# Patient Record
Sex: Male | Born: 1970 | Race: White | Hispanic: No | Marital: Married | Smoking: Former smoker
Health system: Southern US, Community
[De-identification: ages and names within clinical notes are randomized; demographics above are authoritative.]

## PROBLEM LIST (undated history)

## (undated) DIAGNOSIS — G510 Bell's palsy: Secondary | ICD-10-CM

---

## 2016-03-21 ENCOUNTER — Emergency Department (HOSPITAL_COMMUNITY): Payer: Self-pay

## 2016-03-21 ENCOUNTER — Emergency Department (HOSPITAL_COMMUNITY)
Admission: EM | Admit: 2016-03-21 | Discharge: 2016-03-21 | Disposition: A | Discharge status: Home / Self Care | Payer: Self-pay | Attending: Emergency Medicine | Admitting: Emergency Medicine

## 2016-03-21 ENCOUNTER — Encounter (HOSPITAL_COMMUNITY): Payer: Self-pay | Admitting: Emergency Medicine

## 2016-03-21 DIAGNOSIS — R079 Chest pain, unspecified: Secondary | ICD-10-CM | POA: Insufficient documentation

## 2016-03-21 DIAGNOSIS — F17210 Nicotine dependence, cigarettes, uncomplicated: Secondary | ICD-10-CM | POA: Insufficient documentation

## 2016-03-21 LAB — CBC
HCT: 45.2 % (ref 39.0–52.0)
Hemoglobin: 15.2 g/dL (ref 13.0–17.0)
MCH: 30 pg (ref 26.0–34.0)
MCHC: 33.6 g/dL (ref 30.0–36.0)
MCV: 89.2 fL (ref 78.0–100.0)
PLATELETS: 272 10*3/uL (ref 150–400)
RBC: 5.07 MIL/uL (ref 4.22–5.81)
RDW: 13.6 % (ref 11.5–15.5)
WBC: 5.6 10*3/uL (ref 4.0–10.5)

## 2016-03-21 LAB — BASIC METABOLIC PANEL
ANION GAP: 8 (ref 5–15)
BUN: 11 mg/dL (ref 6–20)
CALCIUM: 9.6 mg/dL (ref 8.9–10.3)
CO2: 30 mmol/L (ref 22–32)
CREATININE: 1.1 mg/dL (ref 0.61–1.24)
Chloride: 101 mmol/L (ref 101–111)
GFR calc Af Amer: >60 mL/min (ref >60)
GLUCOSE: 107 mg/dL — AB (ref 65–99)
Potassium: 3.5 mmol/L (ref 3.5–5.1)
Sodium: 139 mmol/L (ref 135–145)

## 2016-03-21 LAB — I-STAT TROPONIN, ED: TROPONIN I, POC: 0 ng/mL (ref 0.00–0.08)

## 2016-03-21 MED ORDER — IBUPROFEN 600 MG PO TABS: 600.0000 mg | ORAL_TABLET | Freq: Four times a day (QID) | ORAL | 0 refills | Status: DC | PRN

## 2016-03-21 MED ORDER — OMEPRAZOLE 20 MG PO CPDR: 20.0000 mg | DELAYED_RELEASE_CAPSULE | Freq: Every day | ORAL | 0 refills | Status: DC

## 2016-03-21 NOTE — ED Triage Notes (Signed)
Pt from home with c/o sharp central/left chest pain "for a while but got worse a couple of days ago."  Pt reports increased pain with inspiration.  Pt additionally reports feeling pressure in his head prior to getting dizzy.  NAD, A&O.

## 2016-03-21 NOTE — ED Notes (Signed)
Pt d/c home with significant other at this time.

## 2016-03-21 NOTE — Discharge Instructions (Signed)
If [facility] is not accepting new patients you may call the phone number listed at the end of this packet for assistance in establishing primary care. Return to the ER for new or worsening symptoms.

## 2016-03-22 NOTE — ED Provider Notes (Signed)
[facility]-EMERGENCY DEPT Provider Note   CSN: [id number] Arrival date & time: 03/21/16  1407     History   Chief Complaint Chief Complaint  Patient presents with  . Chest Pain  . Shortness of Breath    HPI   Steven Pruitt is an 45 y.o. male who presents to the ED for evaluation of chest pain. He states for the past two years he constantly feels a nagging soreness in his left chest that at times worsens in severity. It has acutely worsened over the past couple of days. The pain is constant. It does not radiate. He states at times he feels an associated headache. Denies SOB. Denies trying anything for the pain. He currently rates it 3/10. He has never been evaluated for the pain anytime in the past two years. It is not worse with exertion or position change. Denies leg pain or swelling. Denies recent travel. Denies history of clots or malignancy. He does endorse smoking MJ and cigarettes daily. Denies fever, chills, abdominal pain, n/v/d, weakness, numbness. He does not know if he has any significant fam hx.  History reviewed. No pertinent past medical history.  There are no active problems to display for this patient.   History reviewed. No pertinent surgical history.     Home Medications    Prior to Admission medications   Medication Sig Start Date End Date Taking? Authorizing Provider  calcium carbonate (TUMS - DOSED IN MG ELEMENTAL CALCIUM) 500 MG chewable tablet Chew 1-2 tablets by mouth daily.   Yes Historical Provider, MD  ibuprofen (ADVIL,MOTRIN) 600 MG tablet Take 1 tablet (600 mg total) by mouth every 6 (six) hours as needed (pain). 03/21/16   Ace Gins Wrangler Penning, PA-C  omeprazole (PRILOSEC) 20 MG capsule Take 1 capsule (20 mg total) by mouth daily. 03/21/16   Carlene Coria, PA-C    Family History History reviewed. No pertinent family history.  Social History Social History  Substance Use Topics  . Smoking status: Current Every Day Smoker    Packs/day: 0.50    Types: Cigarettes   . Smokeless tobacco: Never Used  . Alcohol use Yes     Comment: occasionally     Allergies   Review of patient's allergies indicates no known allergies.   Review of Systems Review of Systems 10 Systems reviewed and are negative for acute change except as noted in the HPI.  Physical Exam Updated Vital Signs BP 115/79 (BP Location: Left Arm)   Pulse 64   Temp 98.1 F (36.7 C) (Oral)   Resp 18   SpO2 100%   Physical Exam  Constitutional: He is oriented to person, place, and time.  HENT:  Right Ear: External ear normal.  Left Ear: External ear normal.  Nose: Nose normal.  Mouth/Throat: Oropharynx is clear and moist. No oropharyngeal exudate.  Eyes: Conjunctivae are normal.  Neck: Neck supple.  Cardiovascular: Normal rate, regular rhythm, normal heart sounds and intact distal pulses.   Pulmonary/Chest: Effort normal and breath sounds normal. No respiratory distress. He has no wheezes.  Abdominal: Soft. Bowel sounds are normal. He exhibits no distension. There is no tenderness. There is no rebound and no guarding.  Musculoskeletal: He exhibits no edema.  No calf edema or tenderness 2+ peripheral pulses x 4  Lymphadenopathy:    He has no cervical adenopathy.  Neurological: He is alert and oriented to person, place, and time. No cranial nerve deficit.  Skin: Skin is warm and dry.  Psychiatric: He has a normal  mood and affect.  Nursing note and vitals reviewed.    ED Treatments / Results  Labs (all labs ordered are listed, but only abnormal results are displayed) Labs Reviewed  BASIC METABOLIC PANEL - Abnormal; Notable for the following:       Result Value   Glucose, Bld 107 (*)    All other components within normal limits  CBC  I-STAT TROPOININ, ED    EKG  EKG Interpretation None       Radiology Dg Chest 2 View  Result Date: 03/21/2016 CLINICAL DATA:  Chest pain EXAM: CHEST  2 VIEW COMPARISON:  None. FINDINGS: Lungs are clear.  No pleural effusion or  pneumothorax. The heart is normal in size. Visualized osseous structures are within normal limits. IMPRESSION: Normal chest radiographs. Electronically Signed   By: Charline BillsSriyesh  Krishnan M.D.   On: 03/21/2016 15:12    Procedures Procedures (including critical care time)  Medications Ordered in ED Medications - No data to display   Initial Impression / Assessment and Plan / ED Course  I have reviewed the triage vital signs and the nursing notes.  Pertinent labs & imaging results that were available during my care of the patient were reviewed by me and considered in my medical decision making (see chart for details).  Clinical Course    Pt is an otherwise healthy 45 y.o. male presenting with two years of chest pain that has worsened over the past few days. It is constant. Not worse with exertion. EKG nonacute. Troponin 0, CXR negative. Labs otherwise unremarkable. nonfocal exam. HEART score 1. Doubt ACS. PERC 0. Dobut PE. Discussed with pt that there are many possible etiologies of chest pain. Up on further discussion he does endorse frequent heartburn and reflux issues. Will start on PPI. He is getting insurance within the next month and I encouraged him to follow up with primary care as soon as possible, and consider referral to cardiology if financially feasible. ER return precautions given.  Final Clinical Impressions(s) / ED Diagnoses   Final diagnoses:  Chest pain, unspecified chest pain type    New Prescriptions Discharge Medication List as of 03/21/2016  7:39 PM    START taking these medications   Details  ibuprofen (ADVIL,MOTRIN) 600 MG tablet Take 1 tablet (600 mg total) by mouth every 6 (six) hours as needed (pain)., Starting Tue 03/21/2016, Print    omeprazole (PRILOSEC) 20 MG capsule Take 1 capsule (20 mg total) by mouth daily., Starting Tue 03/21/2016, Print         Carlene CoriaSerena Y Rasean Joos, PA-C 03/22/16 1516    Jerelyn ScottMartha Linker, MD 03/24/16 [phone]

## 2017-01-10 ENCOUNTER — Emergency Department (HOSPITAL_COMMUNITY)
Admission: EM | Admit: 2017-01-10 | Discharge: 2017-01-11 | Disposition: A | Discharge status: Home / Self Care | Payer: Self-pay | Attending: Emergency Medicine | Admitting: Emergency Medicine

## 2017-01-10 ENCOUNTER — Encounter (HOSPITAL_COMMUNITY): Payer: Self-pay | Admitting: *Deleted

## 2017-01-10 DIAGNOSIS — F10930 Alcohol use, unspecified with withdrawal, uncomplicated: Secondary | ICD-10-CM

## 2017-01-10 DIAGNOSIS — F10230 Alcohol dependence with withdrawal, uncomplicated: Secondary | ICD-10-CM | POA: Insufficient documentation

## 2017-01-10 DIAGNOSIS — Z87891 Personal history of nicotine dependence: Secondary | ICD-10-CM | POA: Insufficient documentation

## 2017-01-10 HISTORY — DX: Bell's palsy: G51.0

## 2017-01-10 LAB — COMPREHENSIVE METABOLIC PANEL
ALBUMIN: 4.2 g/dL (ref 3.5–5.0)
ALK PHOS: 69 U/L (ref 38–126)
ALT: 25 U/L (ref 17–63)
ANION GAP: 8 (ref 5–15)
AST: 23 U/L (ref 15–41)
BILIRUBIN TOTAL: 0.7 mg/dL (ref 0.3–1.2)
BUN: 17 mg/dL (ref 6–20)
CALCIUM: 9.6 mg/dL (ref 8.9–10.3)
CO2: 30 mmol/L (ref 22–32)
Chloride: 104 mmol/L (ref 101–111)
Creatinine, Ser: 1.13 mg/dL (ref 0.61–1.24)
GFR calc Af Amer: >60 mL/min (ref >60)
GLUCOSE: 110 mg/dL — AB (ref 65–99)
Potassium: 3.9 mmol/L (ref 3.5–5.1)
Sodium: 142 mmol/L (ref 135–145)
TOTAL PROTEIN: 7.8 g/dL (ref 6.5–8.1)

## 2017-01-10 LAB — CBC
HEMATOCRIT: 42.5 % (ref 39.0–52.0)
HEMOGLOBIN: 14.8 g/dL (ref 13.0–17.0)
MCH: 30.3 pg (ref 26.0–34.0)
MCHC: 34.8 g/dL (ref 30.0–36.0)
MCV: 86.9 fL (ref 78.0–100.0)
Platelets: 247 10*3/uL (ref 150–400)
RBC: 4.89 MIL/uL (ref 4.22–5.81)
RDW: 12.6 % (ref 11.5–15.5)
WBC: 6 10*3/uL (ref 4.0–10.5)

## 2017-01-10 MED ORDER — LORAZEPAM 1 MG PO TABS
1.0000 mg | ORAL_TABLET | Freq: Once | ORAL | Status: AC
Start: 2017-01-10 — End: 2017-01-10
  Administered 2017-01-10: 1 mg via ORAL
  Filled 2017-01-10: qty 1

## 2017-01-10 MED ORDER — VITAMIN B-1 100 MG PO TABS: 100.0000 mg | ORAL_TABLET | Freq: Every day | ORAL | 0 refills | Status: AC

## 2017-01-10 MED ORDER — VITAMIN B-1 100 MG PO TABS
100.0000 mg | ORAL_TABLET | Freq: Once | ORAL | Status: AC
  Administered 2017-01-10: 100 mg via ORAL
  Filled 2017-01-10: qty 1

## 2017-01-10 MED ORDER — CHLORDIAZEPOXIDE HCL 25 MG PO CAPS: ORAL_CAPSULE | ORAL | 0 refills | Status: AC

## 2017-01-10 NOTE — ED Triage Notes (Addendum)
Pt reports having pressure in the top of his head that radiates down the left side of his face/head. Pt reports confusion and the pt's wife states he hasn't been acting himself, is slow to answer questions, and is forgetful. Pt reports chest tightness with feelings of pins and needles. Pt states, "I feel like I am in a dream" and is seeing stars intermittently. Pt reports he quit drinking 2 weeks ago after drinking for 30 yrs due to his symptoms and quit smoking marijuana 3 days ago after smoking for over 20 yrs.

## 2017-01-10 NOTE — Discharge Instructions (Signed)
Take thiamine and Librium as prescribed. Remain well hydrated. Do not drink alcohol. [facility] symptoms as needed. Return to the emergency department if you develops fever, chills, worsening shakes, or any new or worsening symptoms.

## 2017-01-10 NOTE — ED Provider Notes (Signed)
[facility]-EMERGENCY DEPT Provider Note   CSN: [id number] Arrival date & time: 01/10/17  2050     History   Chief Complaint Chief Complaint  Patient presents with  . Altered Mental Status    HPI Steven Pruitt is a 46 y.o. male presenting with 2 week history of paresthesias, headache, and shakes.  Patient states that he quit drinking 2 weeks ago after drinking heavily for 30 years. Since then, he has felt like he has the shakes internally, has had numbness on the top of his head, and intermittent pins and needle feeling of his scalp and on his chest. He reports the pins and needles feeling comes and goes, and he cannot identify any specific trigger. His coronal numbness is constant. Additionally patient reports past 2 days he's had increased difficulty thinking, and wife reports his responses, even to simple questions, have been slow. He denies fever, chills, nausea, vomiting, abdominal pain, diarrhea, constipation, or urinary symptoms. He denies weakness, vision changes, or dropping things. He reports he has no other medical problems, does not take medications on a daily basis. He denies drug use other than marijuana.  HPI  Past Medical History:  Diagnosis Date  . Bell's palsy     There are no active problems to display for this patient.   History reviewed. No pertinent surgical history.     Home Medications    Prior to Admission medications   Medication Sig Start Date End Date Taking? Authorizing Provider  calcium carbonate (TUMS EX) 750 MG chewable tablet Chew 1-2 tablets by mouth daily.   Yes [provider]  ibuprofen (ADVIL,MOTRIN) 200 MG tablet Take 400 mg by mouth every 6 (six) hours as needed for mild pain or moderate pain.   Yes [provider]  chlordiazePOXIDE (LIBRIUM) 25 MG capsule 50mg  PO TID x 1D, then 25-50mg  PO BID X 1D, then 25-50mg  PO QD X 1D 01/10/17   Anwar Crill, PA-C  thiamine (VITAMIN B-1) 100 MG tablet Take 1 tablet (100 mg total) by  mouth daily. 01/10/17   Ronell Duffus, PA-C    Family History History reviewed. No pertinent family history.  Social History Social History  Substance Use Topics  . Smoking status: Former Smoker    Packs/day: 0.50    Types: Cigarettes  . Smokeless tobacco: Never Used  . Alcohol use Yes     Comment: occasionally     Allergies   Patient has no known allergies.   Review of Systems Review of Systems  All other systems reviewed and are negative.    Physical Exam Updated Vital Signs BP 106/67   Pulse 63   Temp 97.9 F (36.6 C) (Oral)   Resp 12   Ht 5\' 6"  (1.676 m)   Wt 72.6 kg (160 lb)   SpO2 99%   BMI 25.82 kg/m   Physical Exam  Constitutional: He is oriented to person, place, and time. He appears well-developed and well-nourished. No distress.  HENT:  Head: Normocephalic and atraumatic.  Right Ear: Tympanic membrane, external ear and ear canal normal.  Left Ear: Tympanic membrane, external ear and ear canal normal.  Nose: Nose normal.  Mouth/Throat: Uvula is midline, oropharynx is clear and moist and mucous membranes are normal.  Eyes: Conjunctivae and EOM are normal. Pupils are equal, round, and reactive to light.  Neck: Normal range of motion. Neck supple.  Cardiovascular: Normal rate, regular rhythm, normal heart sounds and intact distal pulses.   Pulmonary/Chest: Effort normal and breath sounds normal. No  respiratory distress. He has no wheezes.  Abdominal: Soft. Bowel sounds are normal. He exhibits no distension. There is no tenderness.  Musculoskeletal: Normal range of motion.  Full range of motion of all extremities. Full range of motion of the neck. Strength intact 4. pulses intact 4. Sensation intact 4. No visible shaking.  Lymphadenopathy:    He has no cervical adenopathy.  Neurological: He is alert and oriented to person, place, and time.  Skin: Skin is warm and dry. No rash noted. He is not diaphoretic.  Psychiatric: He has a normal mood and  affect.  Nursing note and vitals reviewed.    ED Treatments / Results  Labs (all labs ordered are listed, but only abnormal results are displayed) Labs Reviewed  COMPREHENSIVE METABOLIC PANEL - Abnormal; Notable for the following:       Result Value   Glucose, Bld 110 (*)    All other components within normal limits  CBC    EKG  EKG Interpretation None       Radiology No results found.  Procedures Procedures (including critical care time)  Medications Ordered in ED Medications  thiamine (VITAMIN B-1) tablet 100 mg (100 mg Oral Given 01/10/17 2227)  LORazepam (ATIVAN) tablet 1 mg (1 mg Oral Given 01/10/17 2227)     Initial Impression / Assessment and Plan / ED Course  I have reviewed the triage vital signs and the nursing notes.  Pertinent labs & imaging results that were available during my care of the patient were reviewed by me and considered in my medical decision making (see chart for details).     Patient presenting with paresthesias, confusion, and shakes x2 wks. This began after he quit drinking after 30 years of extensive alcohol use. No fever, chills, abdominal pain, nausea, vomiting, or signs of seizure. Physical exam reassuring. Will order basic labs and EKG. Will give thiamine and Ativan for symptom control.  Labs and EKG reassuring. On reassessment, patient states he feels significantly better, he reports he no longer feels shaky, he feels like his paresthesias are gone, and his numbness is improved. Will discharge patient with prescription for Librium and thiamine. Patient to establish primary care and follow-up for further symptom relief as needed. Discussed case with attending and Dr. Estell Harpin agrees to plan. Discussed findings and plan with patient. Return precautions given. Patient and his wife state they understand and he agrees to plan.   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     Alveria Apley, PA-C 01/11/17 Tia Masker, MD 01/11/17 2337

## 2018-03-29 IMAGING — DX DG CHEST 2V
2 series · 2 of 2 positions shown · non-contrast
Comparison: None.

CLINICAL DATA: Chest pain

EXAM:
CHEST  2 VIEW

[chest pa]
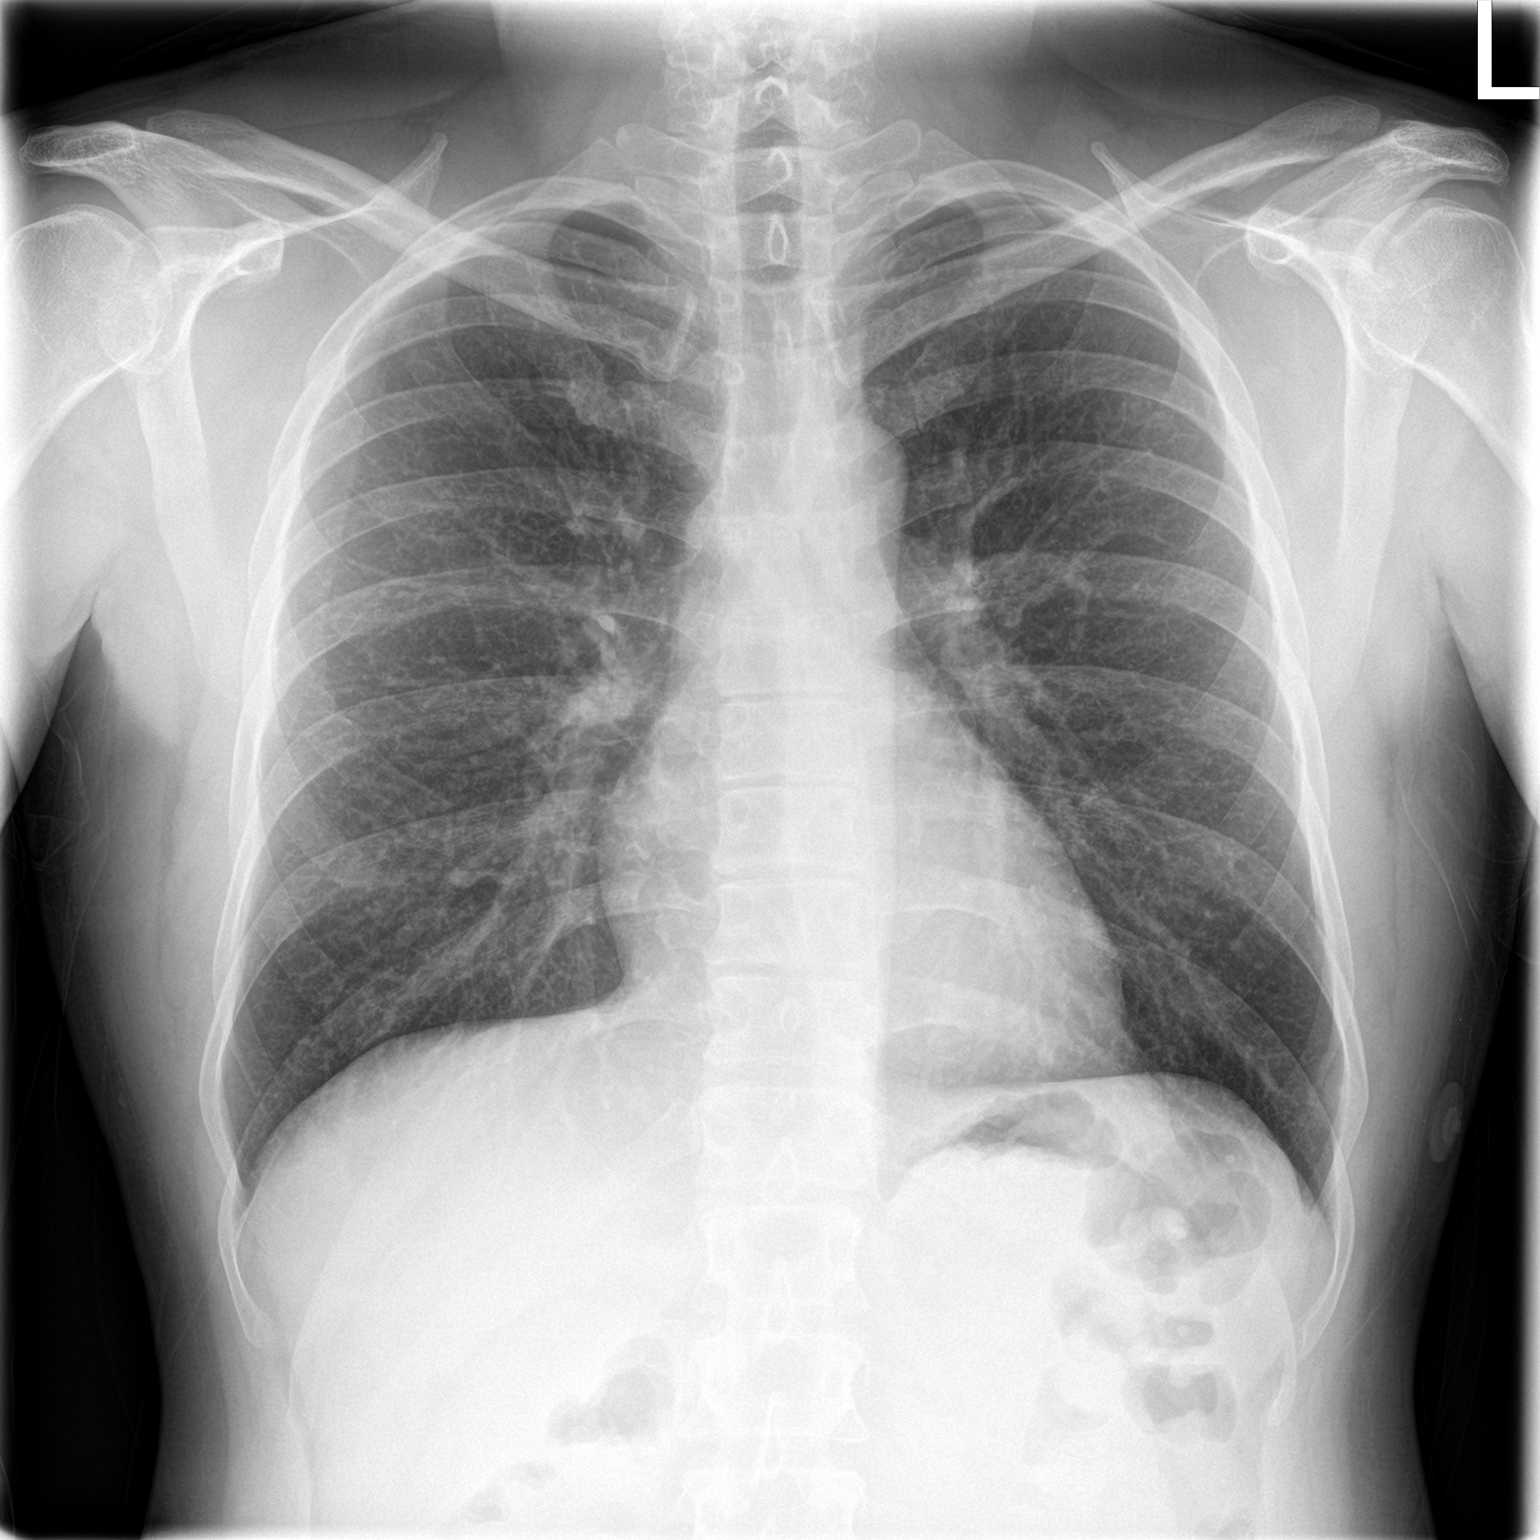

[chest lat]
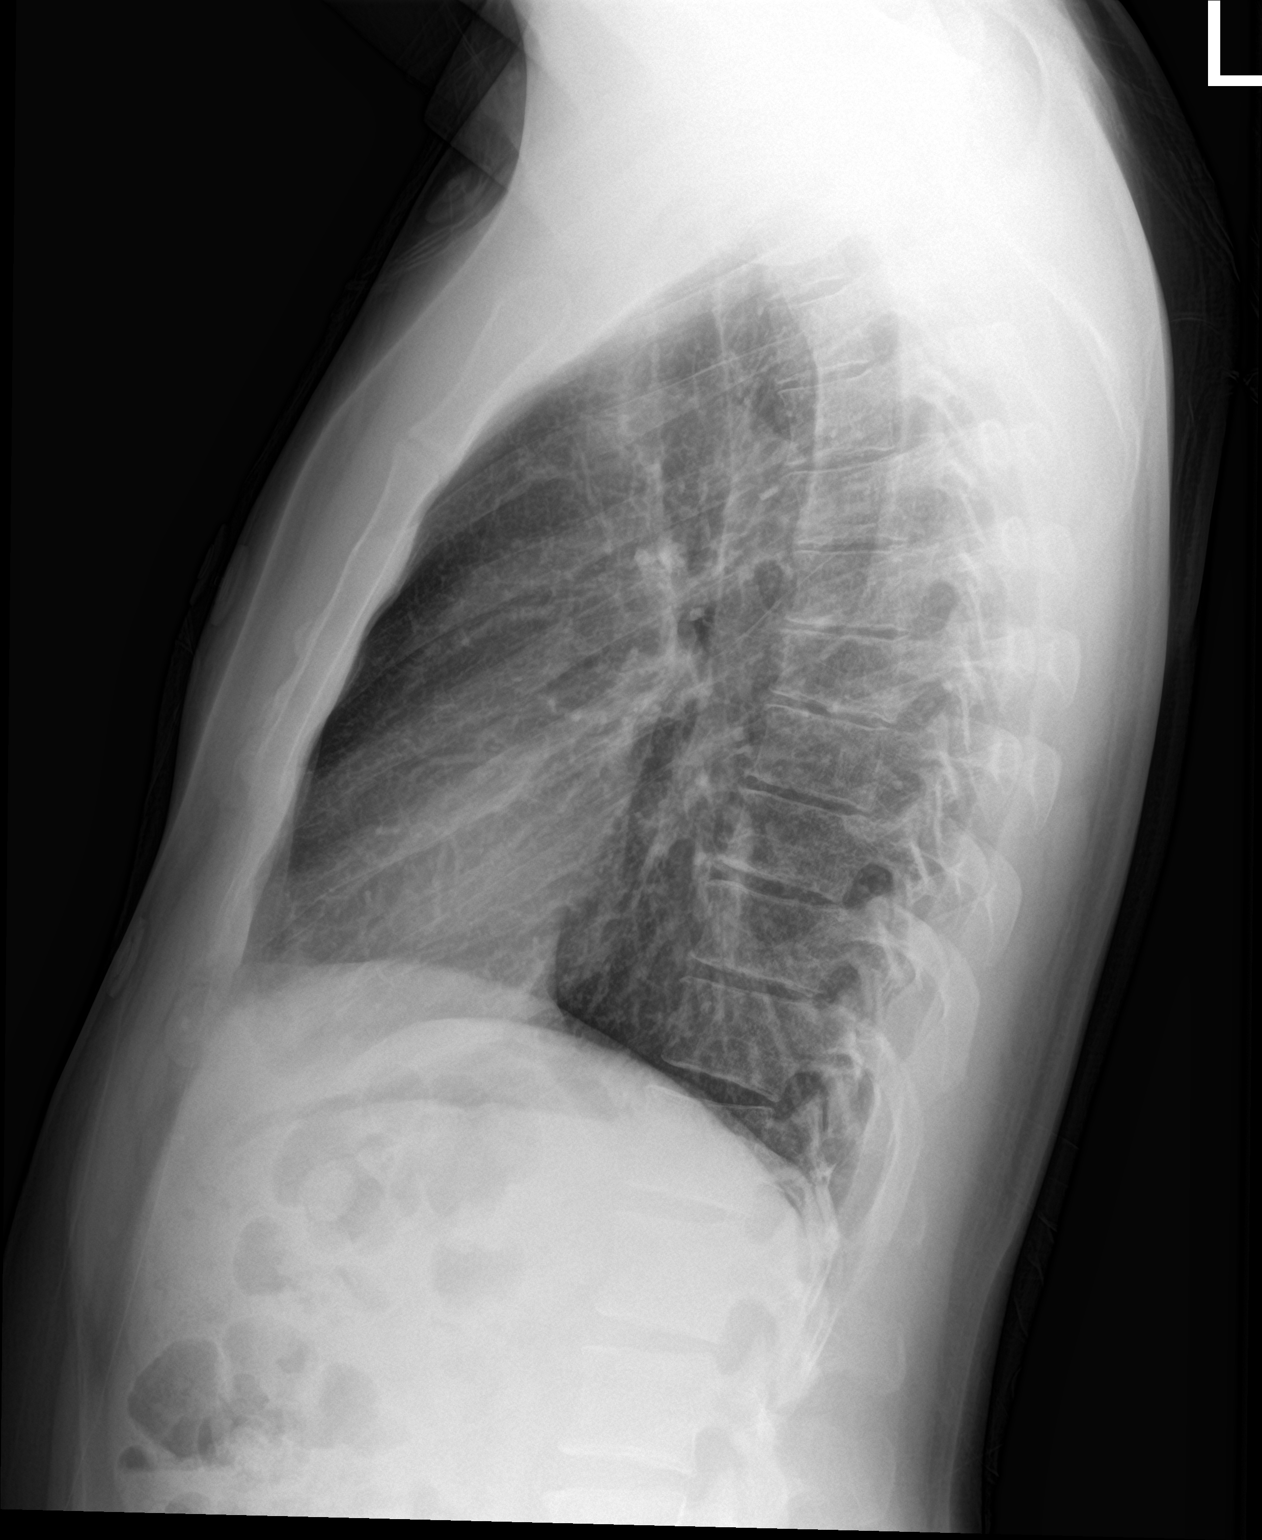

[2 of 2 positions shown; findings below may reference images not displayed]

FINDINGS: Lungs are clear.  No pleural effusion or pneumothorax.

The heart is normal in size.

Visualized osseous structures are within normal limits.
IMPRESSION: Normal chest radiographs.
